# Patient Record
Sex: Male | Born: 1995 | Race: Black or African American | Hispanic: No | Marital: Single | Smoking: Never smoker
Health system: Southern US, Community
[De-identification: ages and names within clinical notes are randomized; demographics above are authoritative.]

---

## 2015-08-14 ENCOUNTER — Emergency Department (HOSPITAL_COMMUNITY): Admission: EM | Admit: 2015-08-14 | Discharge: 2015-08-14 | Discharge status: Absent without leave | Payer: Self-pay

## 2018-04-20 ENCOUNTER — Encounter (HOSPITAL_COMMUNITY): Payer: Self-pay | Admitting: Emergency Medicine

## 2018-04-20 ENCOUNTER — Emergency Department (HOSPITAL_COMMUNITY): Payer: Self-pay

## 2018-04-20 ENCOUNTER — Other Ambulatory Visit: Payer: Self-pay

## 2018-04-20 ENCOUNTER — Emergency Department (HOSPITAL_COMMUNITY)
Admission: EM | Admit: 2018-04-20 | Discharge: 2018-04-21 | Disposition: A | Discharge status: Home / Self Care | Payer: Self-pay | Attending: Emergency Medicine | Admitting: Emergency Medicine

## 2018-04-20 DIAGNOSIS — R002 Palpitations: Secondary | ICD-10-CM | POA: Insufficient documentation

## 2018-04-20 LAB — I-STAT TROPONIN, ED: TROPONIN I, POC: 0 ng/mL (ref 0.00–0.08)

## 2018-04-20 NOTE — ED Triage Notes (Signed)
C/o intermittent episodes of fast HR with nausea and intermittent sharp headache x 2-3 days.

## 2018-04-21 LAB — BASIC METABOLIC PANEL
Anion gap: 8 (ref 5–15)
BUN: 17 mg/dL (ref 6–20)
CALCIUM: 9.6 mg/dL (ref 8.9–10.3)
CHLORIDE: 106 mmol/L (ref 98–111)
CO2: 27 mmol/L (ref 22–32)
Creatinine, Ser: 1.05 mg/dL (ref 0.61–1.24)
GFR calc Af Amer: >60 mL/min (ref >60)
GFR calc non Af Amer: >60 mL/min (ref >60)
Glucose, Bld: 98 mg/dL (ref 70–99)
Potassium: 4 mmol/L (ref 3.5–5.1)
Sodium: 141 mmol/L (ref 135–145)

## 2018-04-21 LAB — CBC
HCT: 42.1 % (ref 39.0–52.0)
HEMOGLOBIN: 13.8 g/dL (ref 13.0–17.0)
MCH: 31.2 pg (ref 26.0–34.0)
MCHC: 32.8 g/dL (ref 30.0–36.0)
MCV: 95.2 fL (ref 78.0–100.0)
PLATELETS: 244 10*3/uL (ref 150–400)
RBC: 4.42 MIL/uL (ref 4.22–5.81)
RDW: 11.5 % (ref 11.5–15.5)
WBC: 7.7 10*3/uL (ref 4.0–10.5)

## 2018-04-21 NOTE — Discharge Instructions (Signed)
All of your tests are normal so you can be discharged home. Recommend follow up with primary care - resources provided. Return here with new chest pain, worsening symptoms or new concerns.

## 2018-04-21 NOTE — ED Provider Notes (Signed)
[facility] EMERGENCY DEPARTMENT Provider Note   CSN: [id number] Arrival date & time: 04/20/18  2310     History   Chief Complaint Chief Complaint  Patient presents with  . Tachycardia  . Nausea    HPI Norman Powers is a 22 y.o. male.  Patient here for evaluation of recurring episodes where he feels his heart races. He describes a fast rate rather than any sense of skipping beats. No chest pain, SOB. The symptoms can occur at any time and last a few minutes each time. The first episode happened Sunday (04/18/18) while at rest. No inciting or modifying factors. He takes no regular medications, denies substance use, no excessive caffeine intake.   The history is provided by the patient. No language interpreter was used.    History reviewed. No pertinent past medical history.  There are no active problems to display for this patient.   History reviewed. No pertinent surgical history.      Home Medications    Prior to Admission medications   Not on File    Family History No family history on file.  Social History Social History   Tobacco Use  . Smoking status: Never Smoker  . Smokeless tobacco: Never Used  Substance Use Topics  . Alcohol use: Not Currently  . Drug use: Not Currently     Allergies   Peanut-containing drug products   Review of Systems Review of Systems  Constitutional: Negative for chills and fever.  HENT: Negative.   Respiratory: Negative.  Negative for cough and shortness of breath.   Cardiovascular: Positive for palpitations. Negative for chest pain.  Gastrointestinal: Negative.  Negative for abdominal pain.  Musculoskeletal: Negative.  Negative for myalgias.  Skin: Negative.   Neurological: Negative.  Negative for weakness and headaches.     Physical Exam Updated Vital Signs BP (!) 151/94 (BP Location: Right Wrist)   Pulse 74   Temp 98.8 F (37.1 C) (Oral)   Resp 17   SpO2 100%   Physical Exam    Constitutional: He is oriented to person, place, and time. He appears well-developed and well-nourished.  HENT:  Head: Normocephalic.  Neck: Normal range of motion. Neck supple.  Cardiovascular: Normal rate and regular rhythm.  No murmur heard. Pulmonary/Chest: Effort normal and breath sounds normal. He has no wheezes. He has no rales.  Abdominal: Soft. Bowel sounds are normal. There is no tenderness. There is no rebound and no guarding.  Musculoskeletal: Normal range of motion.  Neurological: He is alert and oriented to person, place, and time.  Skin: Skin is warm and dry. No rash noted.  Psychiatric: He has a normal mood and affect.     ED Treatments / Results  Labs (all labs ordered are listed, but only abnormal results are displayed) Labs Reviewed  BASIC METABOLIC PANEL  CBC  I-STAT TROPONIN, ED   Results for orders placed or performed during the hospital encounter of 04/20/18  Basic metabolic panel  Result Value Ref Range   Sodium 141 135 - 145 mmol/L   Potassium 4.0 3.5 - 5.1 mmol/L   Chloride 106 98 - 111 mmol/L   CO2 27 22 - 32 mmol/L   Glucose, Bld 98 70 - 99 mg/dL   BUN 17 6 - 20 mg/dL   Creatinine, Ser 4.09 0.61 - 1.24 mg/dL   Calcium 9.6 8.9 - 81.1 mg/dL   GFR calc non Af Amer >60 >60 mL/min   GFR calc Af Amer >60 >60  mL/min   Anion gap 8 5 - 15  CBC  Result Value Ref Range   WBC 7.7 4.0 - 10.5 K/uL   RBC 4.42 4.22 - 5.81 MIL/uL   Hemoglobin 13.8 13.0 - 17.0 g/dL   HCT 16.1 09.6 - 04.5 %   MCV 95.2 78.0 - 100.0 fL   MCH 31.2 26.0 - 34.0 pg   MCHC 32.8 30.0 - 36.0 g/dL   RDW 40.9 81.1 - 91.4 %   Platelets 244 150 - 400 K/uL  I-stat troponin, ED  Result Value Ref Range   Troponin i, poc 0.00 0.00 - 0.08 ng/mL   Comment 3            EKG None  Radiology Dg Chest 2 View  Result Date: 04/20/2018 CLINICAL DATA:  22 year old male with tachycardia. EXAM: CHEST - 2 VIEW COMPARISON:  None. FINDINGS: The heart size and mediastinal contours are within  normal limits. Both lungs are clear. The visualized skeletal structures are unremarkable. IMPRESSION: No active cardiopulmonary disease. Electronically Signed   By: Elgie Collard M.D.   On: 04/20/2018 23:49    Procedures Procedures (including critical care time)  Medications Ordered in ED Medications - No data to display   Initial Impression / Assessment and Plan / ED Course  I have reviewed the triage vital signs and the nursing notes.  Pertinent labs & imaging results that were available during my care of the patient were reviewed by me and considered in my medical decision making (see chart for details).     Patient here with episodic fast-rate palpitations x 2 days. No pain, SOB. No history of anxiety, or current stress. Denies drug use, excessive caffeine.  No symptoms currently EKG unremarkable. No electrolyte abnormalities. He can be discharged home and will be referred to primary care for follow up.  Final Clinical Impressions(s) / ED Diagnoses   Final diagnoses:  None   1. Palpitations.  ED Discharge Orders    None       Elpidio Anis, PA-C 04/21/18 0401    Nira Conn, MD 04/21/18 [id number]

## 2019-08-29 IMAGING — CR DG CHEST 2V
2 series · 2 of 2 positions shown · non-contrast
Comparison: None.

CLINICAL DATA: 23-year-old male with tachycardia.

EXAM:
CHEST - 2 VIEW

[chest pa]
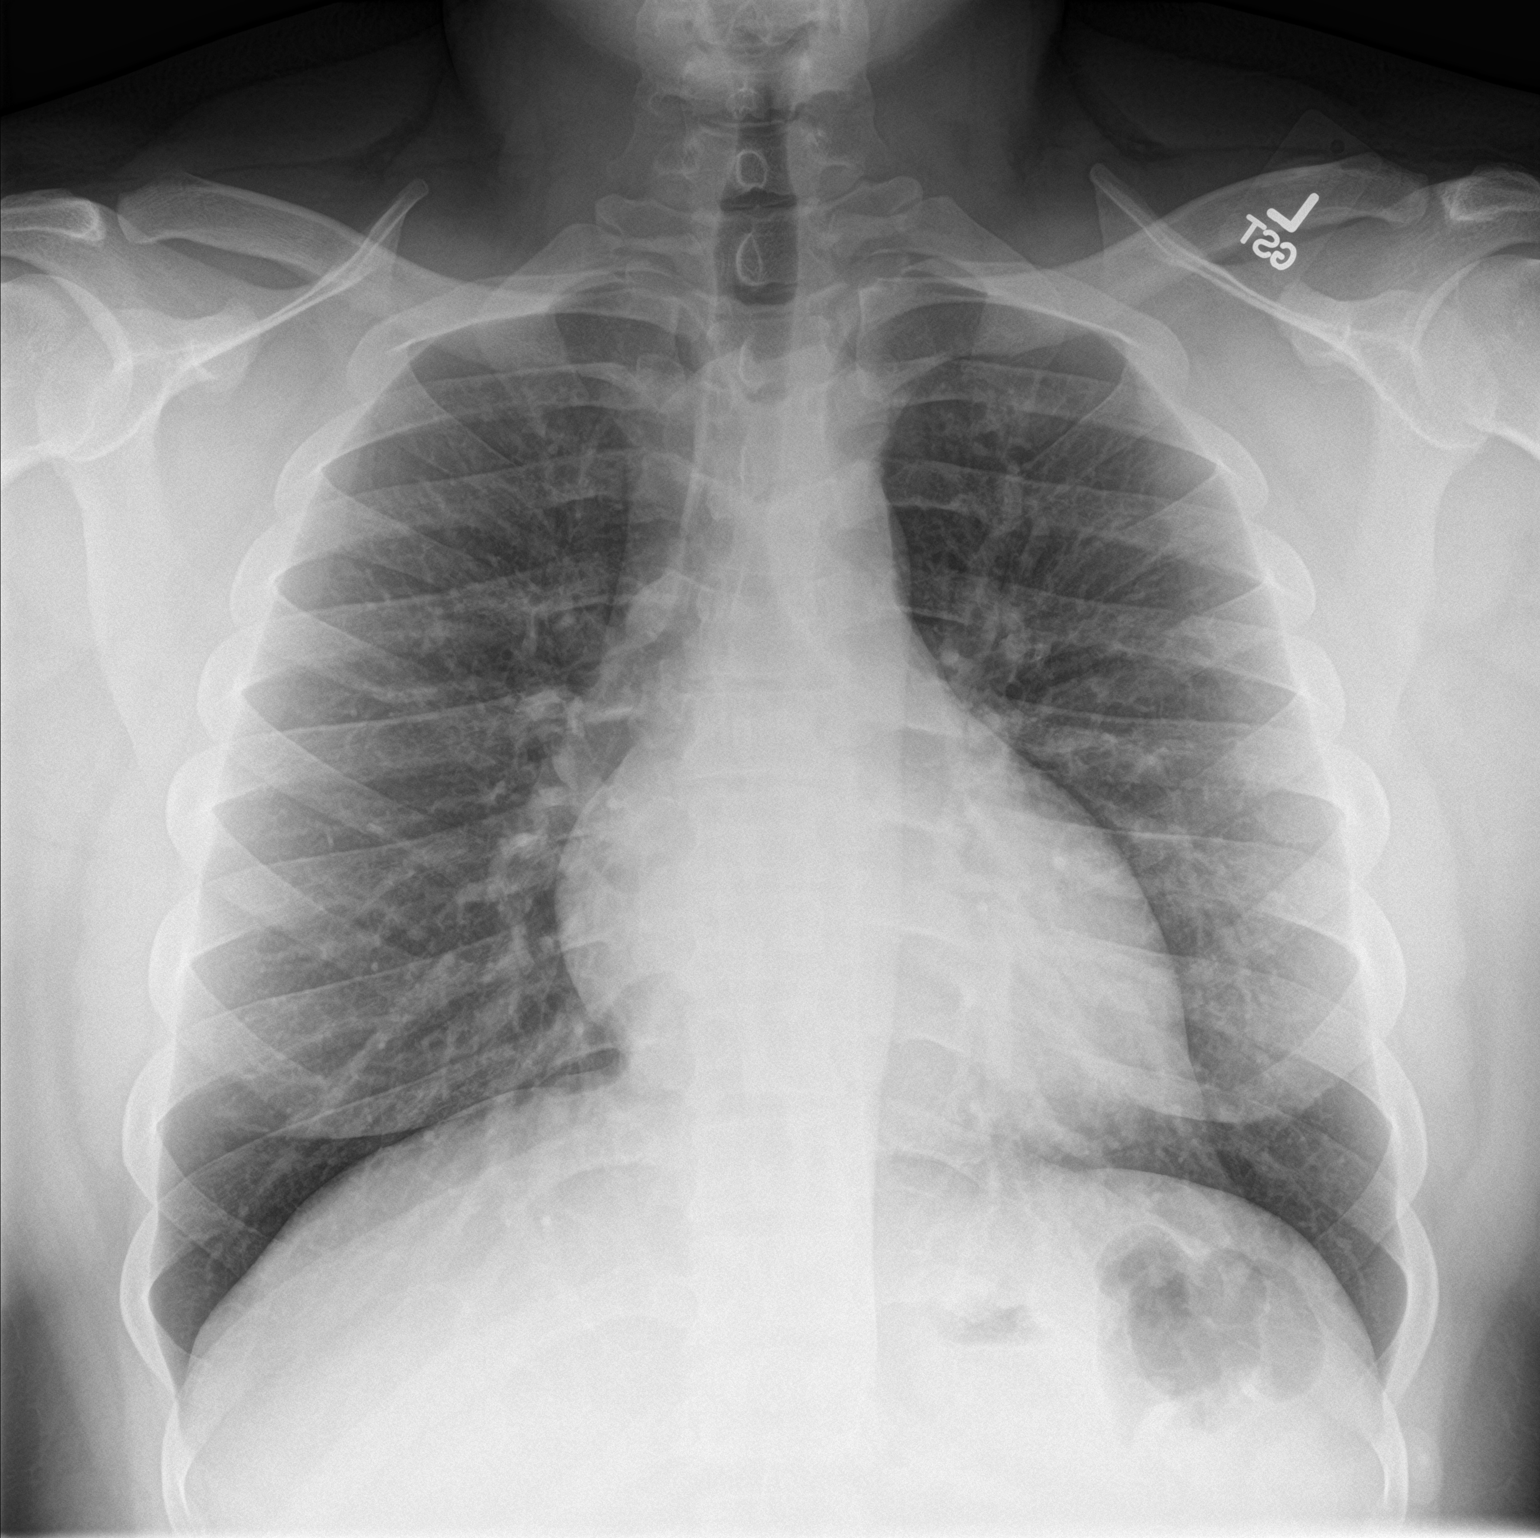

[chest lat]
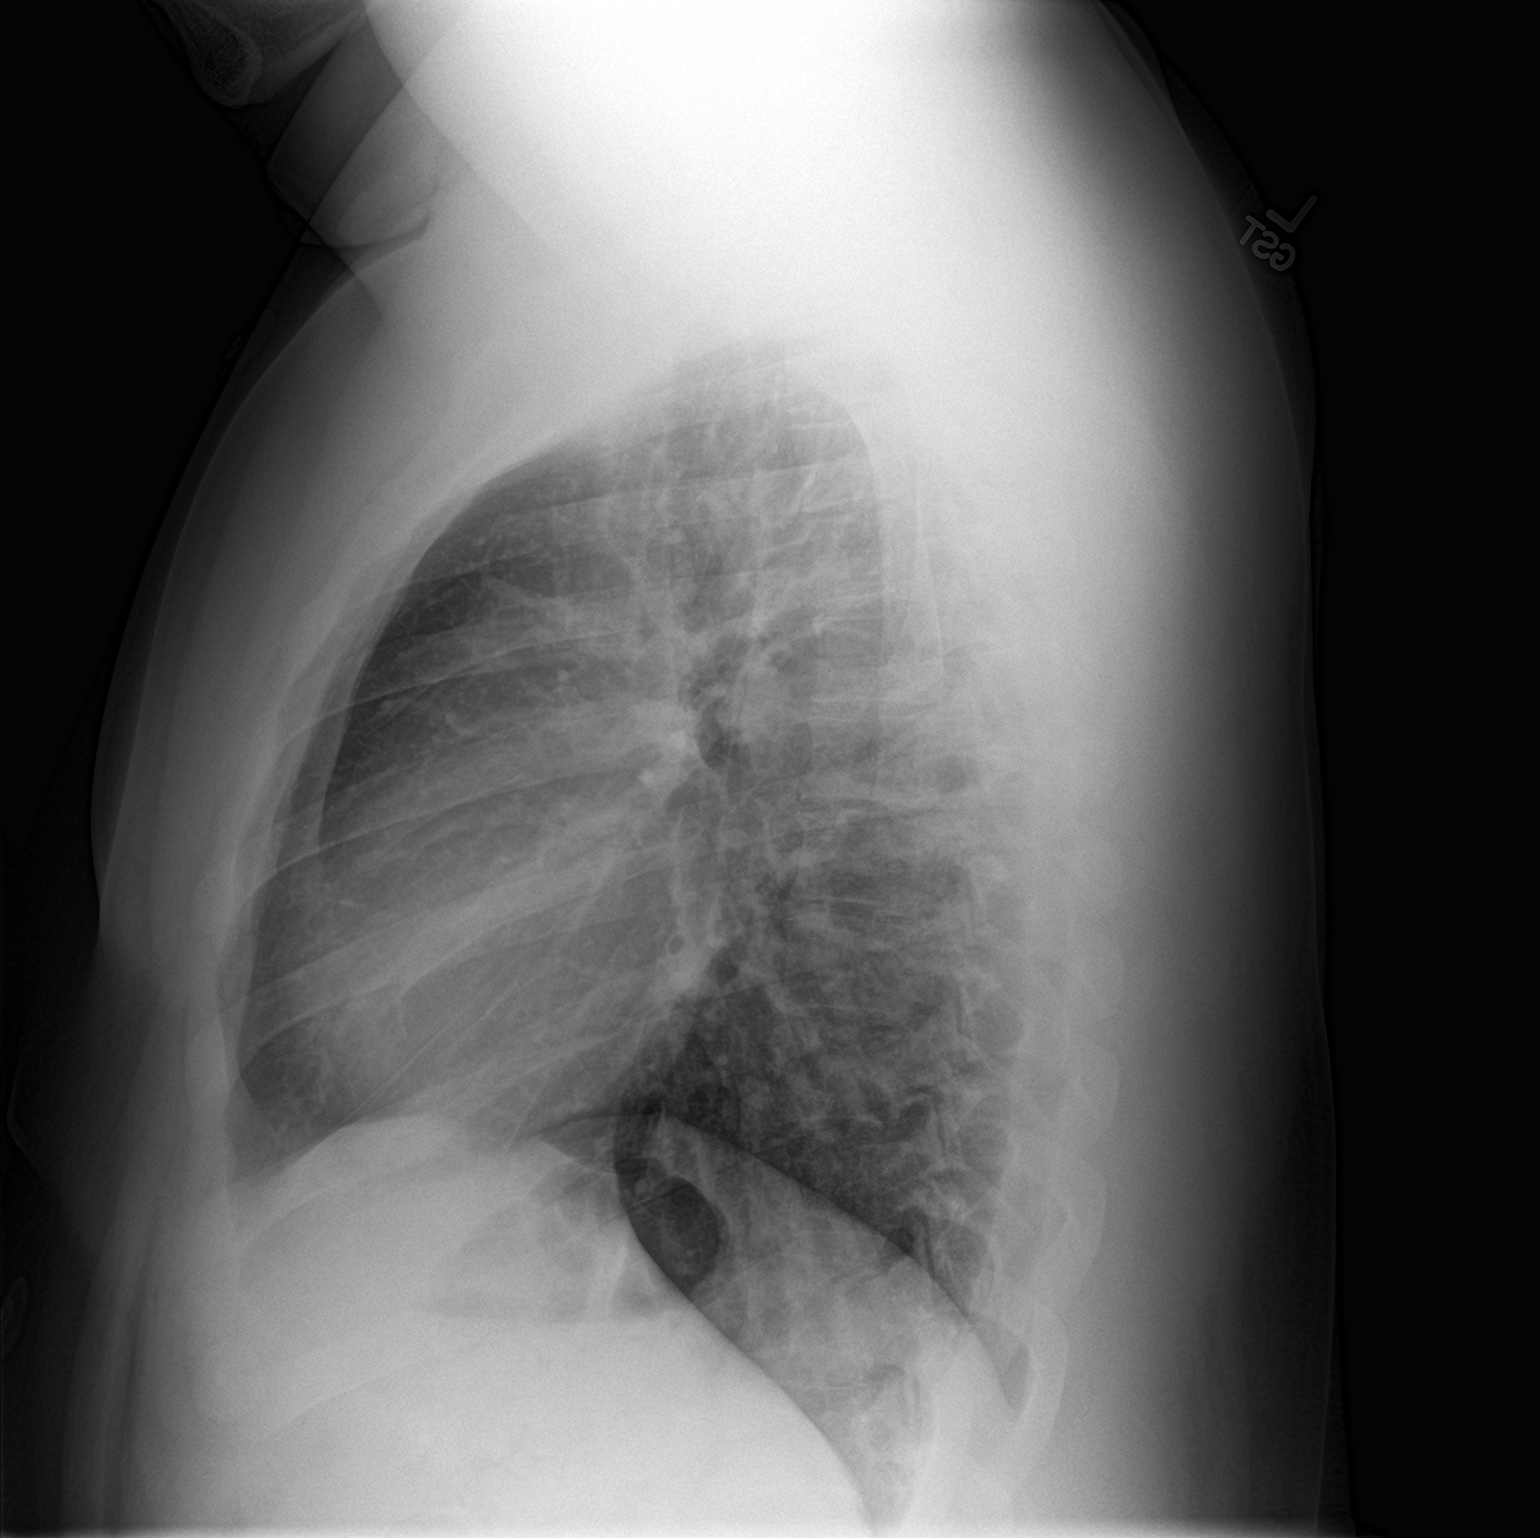

[2 of 2 positions shown; findings below may reference images not displayed]

FINDINGS: The heart size and mediastinal contours are within normal limits.
Both lungs are clear. The visualized skeletal structures are
unremarkable.
IMPRESSION: No active cardiopulmonary disease.

## 2020-05-02 ENCOUNTER — Other Ambulatory Visit: Payer: Self-pay

## 2023-11-19 ENCOUNTER — Emergency Department
Admission: EM | Admit: 2023-11-19 | Discharge: 2023-11-19 | Disposition: A | Discharge status: Home / Self Care | Payer: Worker's Compensation | Attending: Emergency Medicine | Admitting: Emergency Medicine

## 2023-11-19 ENCOUNTER — Other Ambulatory Visit: Payer: Self-pay

## 2023-11-19 ENCOUNTER — Encounter: Payer: Self-pay | Admitting: Intensive Care

## 2023-11-19 DIAGNOSIS — M545 Low back pain, unspecified: Secondary | ICD-10-CM | POA: Diagnosis present

## 2023-11-19 DIAGNOSIS — X500XXA Overexertion from strenuous movement or load, initial encounter: Secondary | ICD-10-CM | POA: Diagnosis not present

## 2023-11-19 DIAGNOSIS — Y990 Civilian activity done for income or pay: Secondary | ICD-10-CM | POA: Insufficient documentation

## 2023-11-19 MED ORDER — METHOCARBAMOL 500 MG PO TABS: 1000.0000 mg | ORAL_TABLET | Freq: Three times a day (TID) | ORAL | 0 refills | Status: AC

## 2023-11-19 NOTE — ED Notes (Signed)
 See triage note  Presents with lower back pain  States developed pain after moving some furniture  Ambulates well to treatment room

## 2023-11-19 NOTE — Discharge Instructions (Signed)
 I believe your back pain is caused by a pulled muscle.  This is best treated with a multimodal approach.  You can take 650 mg of Tylenol and 600 mg of ibuprofen every 6 hours as needed for pain. You can use ice, heat, muscle creams and other topical pain relievers as well.  I have sent a muscle relaxer to your pharmacy. This can be taken every 8 hours as needed for muscle spasms. This medication is sedating, so do not drive for 8 hours after taking it.   Return to the emergency department with any worsening symptoms like numbness in your legs, episodes of incontinence of bladder or bowel or pain you cannot get under control at home.

## 2023-11-19 NOTE — ED Triage Notes (Signed)
 Patient arrived by [facility] from work. Patient was moving furniture and heard a loud pop in his lower back. No previous back problems.   Ambulatory with EMS  EMS vitals: 130/90 b/p 96% RA 90HR

## 2023-11-19 NOTE — ED Provider Notes (Signed)
 [facility] Provider Note    Event Date/Time   First MD Initiated Contact with Patient 11/19/23 1421     (approximate)   History   Back Pain   HPI  Norman Powers is a 28 y.o. male with no PMH who presents for evaluation of lower back pain.  Patient works for a [employer] and was lifting some heavy furniture today when he heard a loud pop in his lower back.  Patient states that he is unable to get up initially after the injury.  Since he is he has been able to walk but has had ongoing pain to the left lower back.  Denies radiation down the legs, numbness, tingling, weakness of the legs.  No loss of bladder or bowel function.  No saddle anesthesia.  No fevers.  No history of IV drug use.  No medication prior to arrival.      Physical Exam   Triage Vital Signs: ED Triage Vitals  Encounter Vitals Group     BP 11/19/23 1400 94/69     Systolic BP Percentile --      Diastolic BP Percentile --      Pulse Rate 11/19/23 1400 88     Resp 11/19/23 1400 18     Temp 11/19/23 1400 98.9 F (37.2 C)     Temp Source 11/19/23 1400 Oral     SpO2 11/19/23 1400 96 %     Weight 11/19/23 1401 263 lb (119.3 kg)     Height 11/19/23 1401 5\' 11"  (1.803 m)     Head Circumference --      Peak Flow --      Pain Score 11/19/23 1401 10     Pain Loc --      Pain Education --      Exclude from Growth Chart --     Most recent vital signs: Vitals:   11/19/23 1400  BP: 94/69  Pulse: 88  Resp: 18  Temp: 98.9 F (37.2 C)  SpO2: 96%   General: Awake, no distress.  CV:  Good peripheral perfusion.  RRR. Resp:  Normal effort.  CTAB. Abd:  No distention.  Other:  Mild tenderness to palpation over the left lower paraspinal muscles.  No tenderness over the spine.  Posterior tibialis pulses 2+ and regular bilaterally.  Sensation maintained in the bilateral lower extremities.  Strength is equal in bilateral lower extremities.  Patellar tendon reflexes 2+ bilaterally.   ED  Results / Procedures / Treatments   Labs (all labs ordered are listed, but only abnormal results are displayed) Labs Reviewed - No data to display   PROCEDURES:  Critical Care performed: No  Procedures   MEDICATIONS ORDERED IN ED: Medications - No data to display   IMPRESSION / MDM / ASSESSMENT AND PLAN / ED COURSE  I reviewed the triage vital signs and the nursing notes.                             28 year old male presents for evaluation of lower back pain.  Vital signs are stable patient NAD on exam.  Differential diagnosis includes, but is not limited to, muscle strain, disc herniation, lumbar radiculopathy, acute cord compression.  Patient's presentation is most consistent with acute, uncomplicated illness.  Given that patient did not have any falls or direct trauma do not feel that imaging would be high yield at this time.  Patient's history is consistent  with a muscle strain.  Very low suspicion for acute cord compression as patient is not presenting with any red flag signs or symptoms.  Recommended multimodal treatment approach including anti-inflammatories, pain reliever with Tylenol, muscle relaxer, ice, heat and topical pain relievers.  Patient voiced understanding, all questions were answered and he was stable for discharge.     FINAL CLINICAL IMPRESSION(S) / ED DIAGNOSES   Final diagnoses:  Acute left-sided low back pain without sciatica     Rx / DC Orders   ED Discharge Orders          Ordered    methocarbamol  (ROBAXIN ) 500 MG tablet  3 times daily        11/19/23 1541             Note:  This document was prepared using Dragon voice recognition software and may include unintentional dictation errors.   Phyliss Breen, PA-C 11/19/23 1543    Marylynn Soho, MD 11/19/23 Carollynn Cirri
